# Patient Record
Sex: Male | Born: 1975 | Race: White | Hispanic: No | Marital: Married | Smoking: Never smoker
Health system: Southern US, Community
[De-identification: ages and names within clinical notes are randomized; demographics above are authoritative.]

---

## 2000-04-03 ENCOUNTER — Emergency Department (HOSPITAL_COMMUNITY): Admission: EM | Admit: 2000-04-03 | Discharge: 2000-04-03 | Payer: Self-pay | Admitting: Emergency Medicine

## 2000-04-04 ENCOUNTER — Encounter: Payer: Self-pay | Admitting: Emergency Medicine

## 2008-02-08 ENCOUNTER — Emergency Department: Payer: Self-pay | Admitting: Emergency Medicine

## 2008-02-08 ENCOUNTER — Other Ambulatory Visit: Payer: Self-pay

## 2008-04-15 ENCOUNTER — Emergency Department: Payer: Self-pay | Admitting: Emergency Medicine

## 2008-07-23 ENCOUNTER — Emergency Department: Payer: Self-pay | Admitting: Emergency Medicine

## 2008-09-18 ENCOUNTER — Emergency Department: Payer: Self-pay | Admitting: Emergency Medicine

## 2008-10-16 ENCOUNTER — Emergency Department: Payer: Self-pay | Admitting: Emergency Medicine

## 2008-10-17 ENCOUNTER — Emergency Department: Payer: Self-pay

## 2008-12-06 IMAGING — CR DG CHEST 2V
1 series · 2 of 2 positions shown · non-contrast
Comparison: none

REASON FOR EXAM: Chest Pain
COMMENTS:

[Series 1: view not recorded · 0.17mm/px · 2 of 2 slices shown]
[im 1/2]
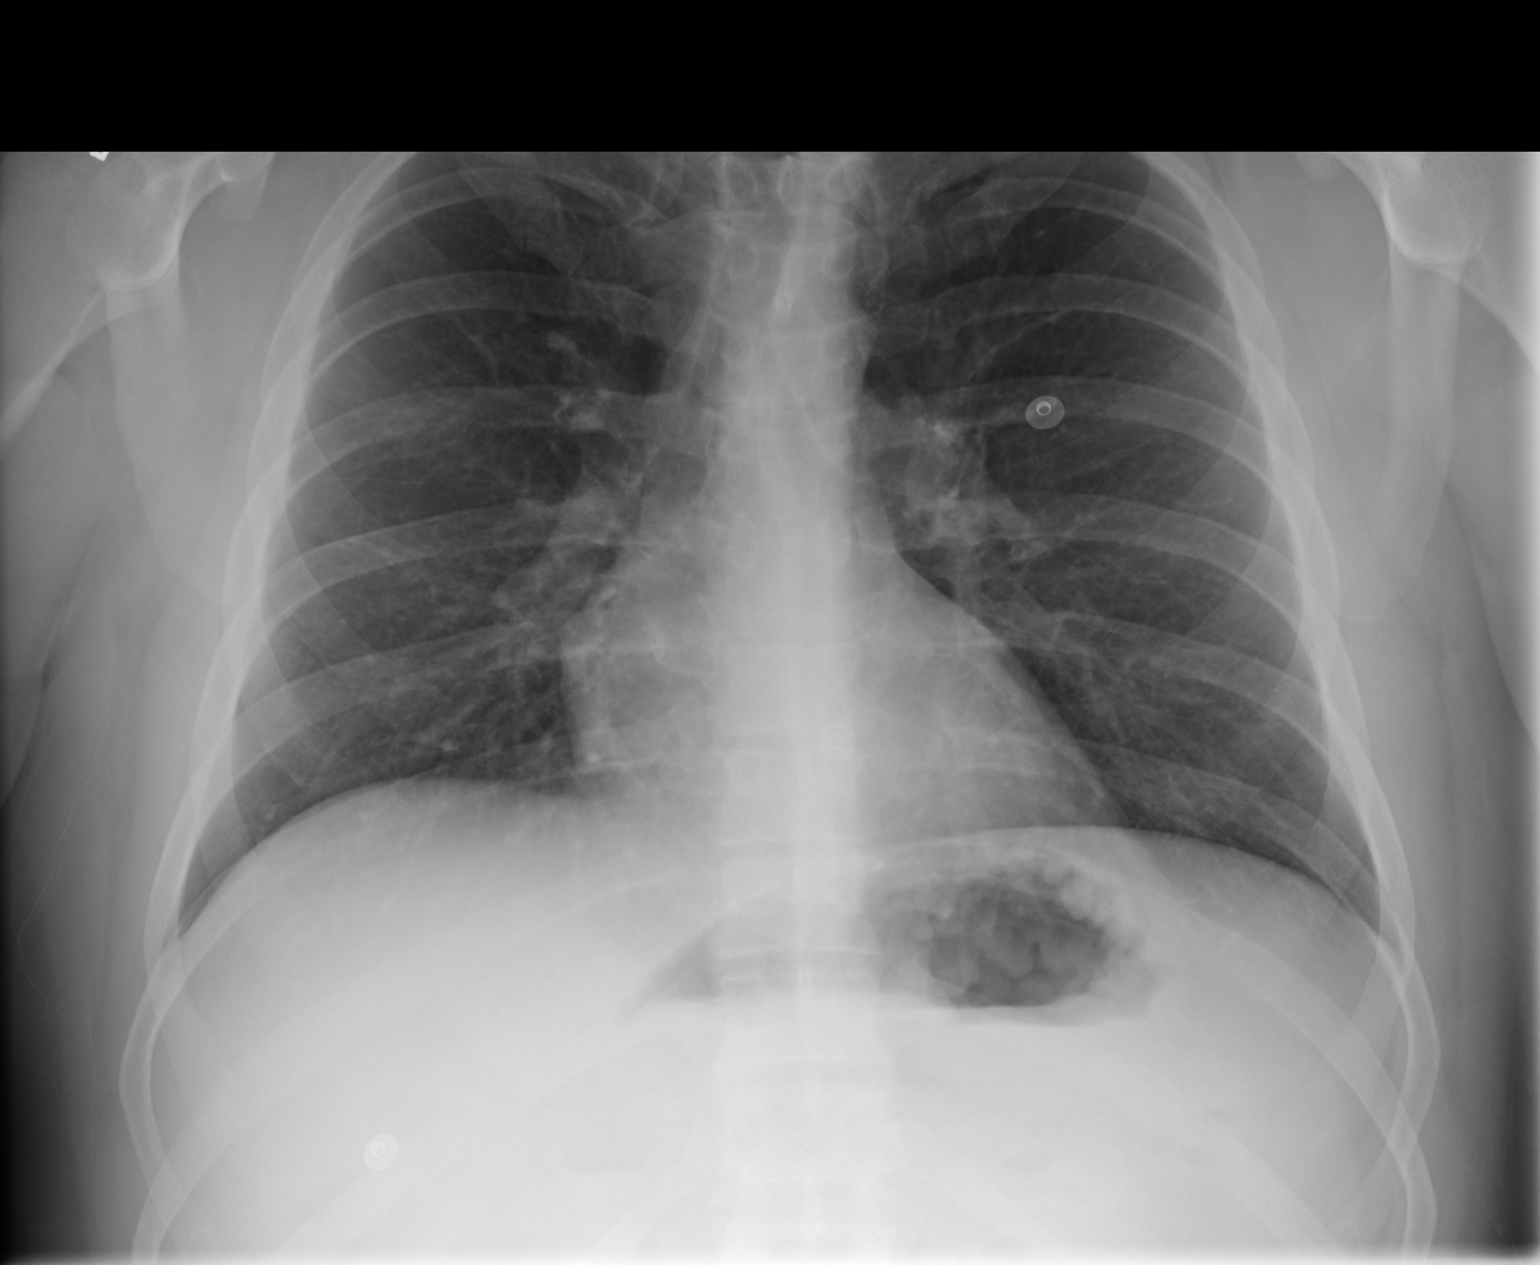
[im 2/2]
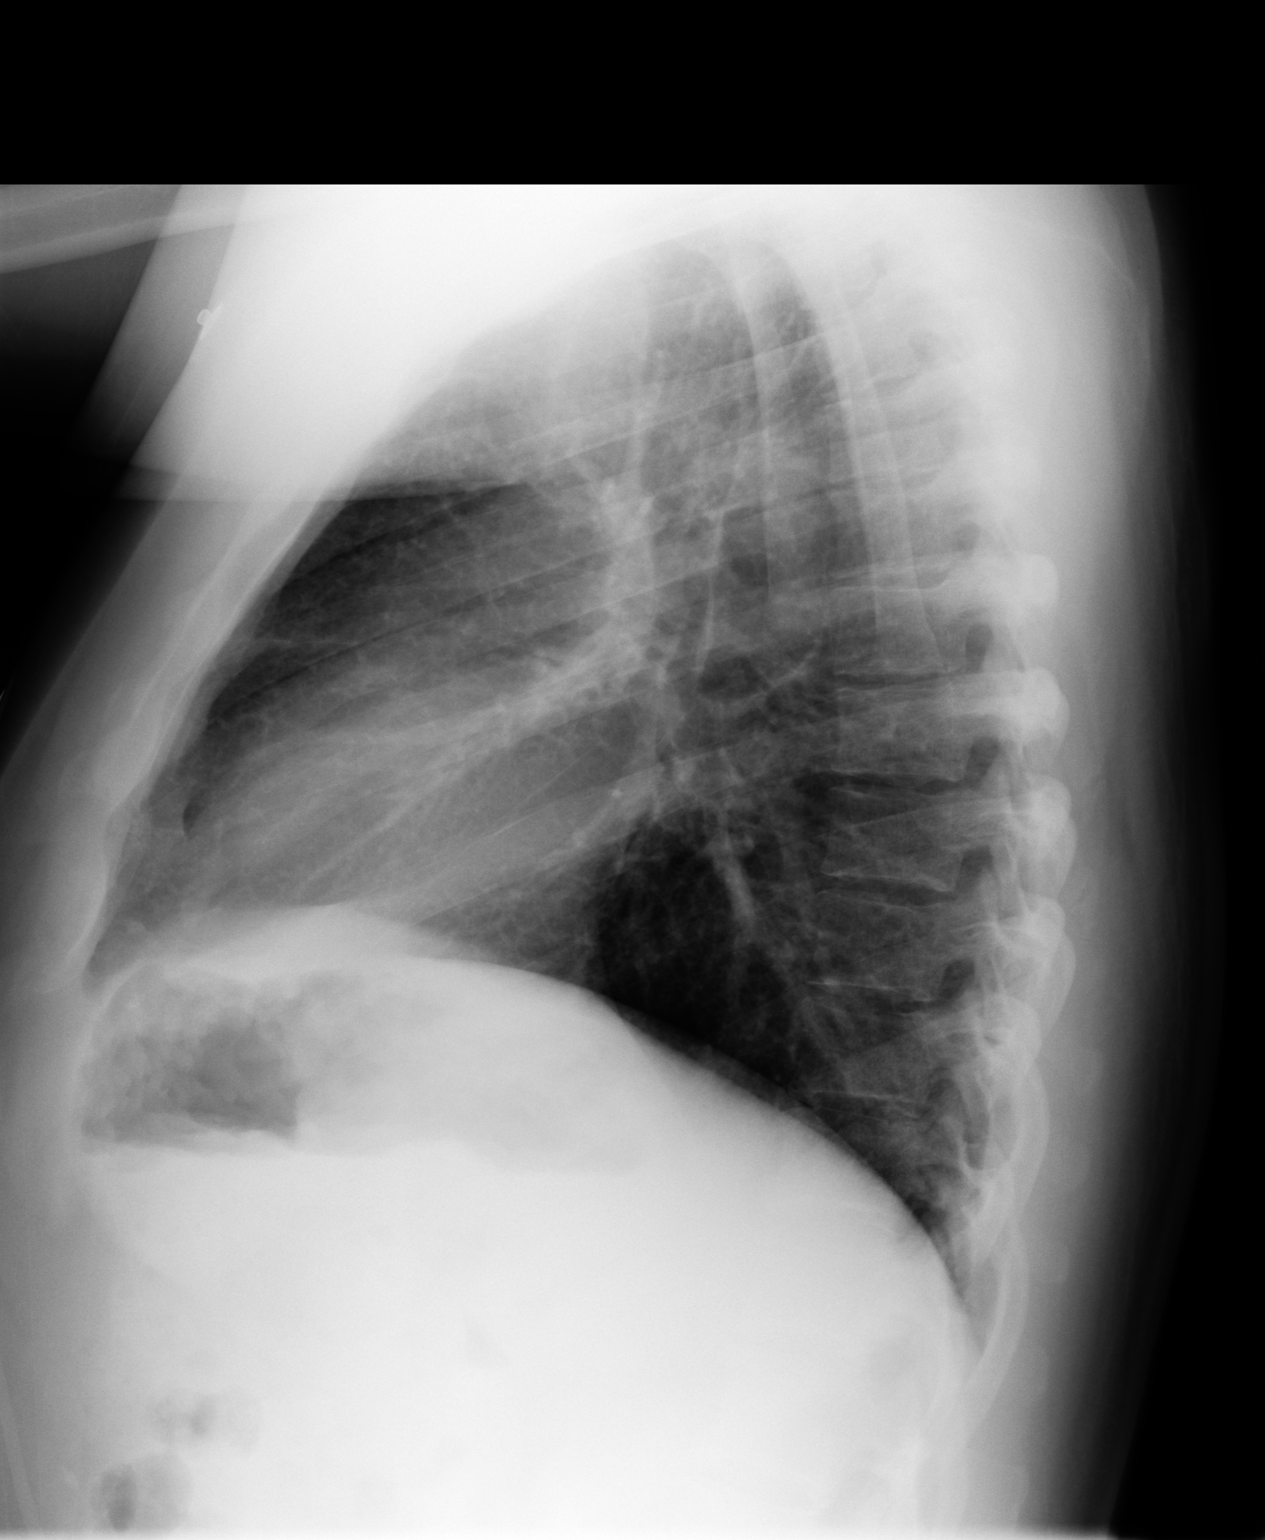

[2 of 2 positions shown; findings below may reference images not displayed]

PROCEDURE:     DXR - DXR CHEST PA (OR AP) AND LATERAL  - February 08, 2008  [DATE]

RESULT:     The lungs are mildly hypoinflated. Very faint increased density
in the RIGHT mid lung is noted. The heart is normal in size. The pulmonary
vascularity is prominent centrally. There is no pleural effusion.
IMPRESSION: The lungs are very mildly hypoinflated.  Subtle increased
density in the RIGHT mid lung is noted. I see no acute abnormality
elsewhere. A followup PA and lateral chest x-ray with deep inspiration would
be useful to assure that there is no very early infiltrate developing in the
right mid lung.

## 2015-05-30 ENCOUNTER — Emergency Department
Admission: EM | Admit: 2015-05-30 | Discharge: 2015-05-30 | Disposition: A | Discharge status: Home / Self Care | Payer: Self-pay | Attending: Emergency Medicine | Admitting: Emergency Medicine

## 2015-05-30 DIAGNOSIS — K219 Gastro-esophageal reflux disease without esophagitis: Secondary | ICD-10-CM | POA: Insufficient documentation

## 2015-05-30 DIAGNOSIS — K529 Noninfective gastroenteritis and colitis, unspecified: Secondary | ICD-10-CM | POA: Insufficient documentation

## 2015-05-30 LAB — CBC WITH DIFFERENTIAL/PLATELET
BASOS ABS: 0.1 10*3/uL (ref 0–0.1)
Basophils Relative: 1 %
Eosinophils Absolute: 0.4 10*3/uL (ref 0–0.7)
Eosinophils Relative: 4 %
HEMATOCRIT: 44 % (ref 40.0–52.0)
HEMOGLOBIN: 15 g/dL (ref 13.0–18.0)
LYMPHS PCT: 39 %
Lymphs Abs: 4.3 10*3/uL — ABNORMAL HIGH (ref 1.0–3.6)
MCH: 30 pg (ref 26.0–34.0)
MCHC: 34.2 g/dL (ref 32.0–36.0)
MCV: 87.8 fL (ref 80.0–100.0)
MONO ABS: 1 10*3/uL (ref 0.2–1.0)
MONOS PCT: 9 %
NEUTROS ABS: 5.1 10*3/uL (ref 1.4–6.5)
NEUTROS PCT: 47 %
Platelets: 347 10*3/uL (ref 150–440)
RBC: 5.01 MIL/uL (ref 4.40–5.90)
RDW: 12.9 % (ref 11.5–14.5)
WBC: 10.8 10*3/uL — ABNORMAL HIGH (ref 3.8–10.6)

## 2015-05-30 LAB — COMPREHENSIVE METABOLIC PANEL
ALBUMIN: 4.3 g/dL (ref 3.5–5.0)
ALK PHOS: 73 U/L (ref 38–126)
ALT: 26 U/L (ref 17–63)
ANION GAP: 7 (ref 5–15)
AST: 27 U/L (ref 15–41)
BILIRUBIN TOTAL: 0.8 mg/dL (ref 0.3–1.2)
BUN: 16 mg/dL (ref 6–20)
CO2: 23 mmol/L (ref 22–32)
Calcium: 9 mg/dL (ref 8.9–10.3)
Chloride: 108 mmol/L (ref 101–111)
Creatinine, Ser: 0.8 mg/dL (ref 0.61–1.24)
GFR calc Af Amer: >60 mL/min (ref >60)
GFR calc non Af Amer: >60 mL/min (ref >60)
GLUCOSE: 112 mg/dL — AB (ref 65–99)
Potassium: 3.6 mmol/L (ref 3.5–5.1)
Sodium: 138 mmol/L (ref 135–145)
Total Protein: 7.5 g/dL (ref 6.5–8.1)

## 2015-05-30 LAB — TROPONIN I: Troponin I: <0.03 ng/mL (ref <0.031)

## 2015-05-30 LAB — LIPASE, BLOOD: Lipase: 30 U/L (ref 11–51)

## 2015-05-30 MED ORDER — GI COCKTAIL ~~LOC~~
30.0000 mL | Freq: Once | ORAL | Status: AC
  Administered 2015-05-30: 30 mL via ORAL

## 2015-05-30 MED ORDER — PANTOPRAZOLE SODIUM 40 MG PO TBEC: 40.0000 mg | DELAYED_RELEASE_TABLET | Freq: Every day | ORAL | Status: DC

## 2015-05-30 MED ORDER — GI COCKTAIL ~~LOC~~
ORAL | Status: AC
  Administered 2015-05-30: 30 mL via ORAL
  Filled 2015-05-30: qty 30

## 2015-05-30 MED ORDER — PANTOPRAZOLE SODIUM 40 MG PO TBEC
DELAYED_RELEASE_TABLET | ORAL | Status: AC
Start: 2015-05-30 — End: 2015-05-30
  Administered 2015-05-30: 40 mg via ORAL
  Filled 2015-05-30: qty 1

## 2015-05-30 MED ORDER — PANTOPRAZOLE SODIUM 40 MG PO TBEC
40.0000 mg | DELAYED_RELEASE_TABLET | Freq: Once | ORAL | Status: AC
  Administered 2015-05-30: 40 mg via ORAL

## 2015-05-30 NOTE — ED Notes (Signed)
Pt in with co left upper abd pain x 1 week, hx of reflux.  Worse after he eats, no vomiting.

## 2015-05-30 NOTE — ED Provider Notes (Signed)
[facility] Emergency Department Provider Note  ____________________________________________  Time seen: 12:50 AM  I have reviewed the triage vital signs and the nursing notes.   HISTORY  Chief Complaint Abdominal Pain     HPI Reginald Randall is a 39 y.o. male presents with epigastric abdominal pain with onset 5 days ago status post eating "[place] food" patient reports nonbloody emesis and diarrhea. Patient states history of GERD   Past medical history GERD There are no active problems to display for this patient.  Past surgical history None No current outpatient prescriptions on file.  Allergies Review of patient's allergies indicates no known allergies.  No family history on file.  Social History Social History  Substance Use Topics  . Smoking status: Not on file  . Smokeless tobacco: Not on file  . Alcohol Use: Not on file    Review of Systems  Constitutional: Negative for fever. Eyes: Negative for visual changes. ENT: Negative for sore throat. Cardiovascular: Negative for chest pain. Respiratory: Negative for shortness of breath. Gastrointestinal: Positive for abdominal pain and vomiting and diarrhea Genitourinary: Negative for dysuria. Musculoskeletal: Negative for back pain. Skin: Negative for rash. Neurological: Negative for headaches, focal weakness or numbness.   10-point ROS otherwise negative.  ____________________________________________   PHYSICAL EXAM:  VITAL SIGNS: ED Triage Vitals  Enc Vitals Group     BP 05/30/15 0009 129/73 mmHg     Pulse Rate 05/30/15 0009 109     Resp 05/30/15 0009 18     Temp 05/30/15 0009 98.6 F (37 C)     Temp Source 05/30/15 0009 Oral     SpO2 05/30/15 0009 97 %     Weight 05/30/15 0009 284 lb (128.822 kg)     Height 05/30/15 0009  (1.778 m)     Head Cir --      Peak Flow --      Pain Score 05/30/15 0009 6     Pain Loc --      Pain Edu? --      Excl. in GC? --     Constitutional: Alert and oriented. Well appearing and in no distress. Eyes: Conjunctivae are normal. PERRL. Normal extraocular movements. ENT   Head: Normocephalic and atraumatic.   Nose: No congestion/rhinnorhea.   Mouth/Throat: Mucous membranes are moist.   Neck: No stridor. Hematological/Lymphatic/Immunilogical: No cervical lymphadenopathy. Cardiovascular: Normal rate, regular rhythm. Normal and symmetric distal pulses are present in all extremities. No murmurs, rubs, or gallops. Respiratory: Normal respiratory effort without tachypnea nor retractions. Breath sounds are clear and equal bilaterally. No wheezes/rales/rhonchi. Gastrointestinal: Epigastric tenderness to palpation No distention. There is no CVA tenderness. Genitourinary: deferred Musculoskeletal: Nontender with normal range of motion in all extremities. No joint effusions.  No lower extremity tenderness nor edema. Neurologic:  Normal speech and language. No gross focal neurologic deficits are appreciated. Speech is normal.  Skin:  Skin is warm, dry and intact. No rash noted. Psychiatric: Mood and affect are normal. Speech and behavior are normal. Patient exhibits appropriate insight and judgment.  ____________________________________________    LABS (pertinent positives/negatives)  Labs Reviewed  CBC WITH DIFFERENTIAL/PLATELET - Abnormal; Notable for the following:    WBC 10.8 (*)    Lymphs Abs 4.3 (*)    All other components within normal limits  COMPREHENSIVE METABOLIC PANEL - Abnormal; Notable for the following:    Glucose, Bld 112 (*)    All other components within normal limits  LIPASE, BLOOD  TROPONIN I  ____________________________________________   EKG  ED ECG REPORT I, Quina Wilbourne, [place] N, the attending physician, personally viewed and interpreted this ECG.   Date: 05/30/2015  EKG Time: 12:16 AM  Rate: 90  Rhythm: Normal sinus rhythm  Axis: None  Intervals: Normal  ST&T  Change: None     INITIAL IMPRESSION / ASSESSMENT AND PLAN / ED COURSE  Pertinent labs & imaging results that were available during my care of the patient were reviewed by me and considered in my medical decision making (see chart for details).  Patient received GI cocktail and Protonix with complete resolution of discomfort. History of physical exam consistent with possible gastroenteritis and GERD.  ____________________________________________   FINAL CLINICAL IMPRESSION(S) / ED DIAGNOSES  Final diagnoses:  Gastroesophageal reflux disease, esophagitis presence not specified  Gastroenteritis      Darci Currentandolph N Alvester Eads, MD 05/30/15 [phone]

## 2015-05-30 NOTE — Discharge Instructions (Signed)

## 2015-05-30 NOTE — ED Notes (Signed)
Pt reports having left sided rib and epigastric pain since last Saturday after eating "chinese food" tpt reports NVD and tenderness to left chest upon palpation. Pt reports hx of GERD but verbalizes the pain is different than usual. Pt also states SOB when lying down and inability to sleep due to pain and SOB.

## 2015-06-10 ENCOUNTER — Emergency Department
Admission: EM | Admit: 2015-06-10 | Discharge: 2015-06-10 | Disposition: A | Discharge status: Home / Self Care | Payer: Self-pay | Attending: Emergency Medicine | Admitting: Emergency Medicine

## 2015-06-10 ENCOUNTER — Encounter: Payer: Self-pay | Admitting: Emergency Medicine

## 2015-06-10 DIAGNOSIS — Z79899 Other long term (current) drug therapy: Secondary | ICD-10-CM | POA: Insufficient documentation

## 2015-06-10 DIAGNOSIS — K2970 Gastritis, unspecified, without bleeding: Secondary | ICD-10-CM | POA: Insufficient documentation

## 2015-06-10 LAB — COMPREHENSIVE METABOLIC PANEL
ALT: 61 U/L (ref 17–63)
ANION GAP: 7 (ref 5–15)
AST: 48 U/L — AB (ref 15–41)
Albumin: 4.2 g/dL (ref 3.5–5.0)
Alkaline Phosphatase: 63 U/L (ref 38–126)
BILIRUBIN TOTAL: 0.4 mg/dL (ref 0.3–1.2)
BUN: 17 mg/dL (ref 6–20)
CHLORIDE: 106 mmol/L (ref 101–111)
CO2: 24 mmol/L (ref 22–32)
Calcium: 9.3 mg/dL (ref 8.9–10.3)
Creatinine, Ser: 0.91 mg/dL (ref 0.61–1.24)
Glucose, Bld: 119 mg/dL — ABNORMAL HIGH (ref 65–99)
POTASSIUM: 3.8 mmol/L (ref 3.5–5.1)
Sodium: 137 mmol/L (ref 135–145)
TOTAL PROTEIN: 7.6 g/dL (ref 6.5–8.1)

## 2015-06-10 LAB — CBC
HEMATOCRIT: 44.5 % (ref 40.0–52.0)
HEMOGLOBIN: 15.1 g/dL (ref 13.0–18.0)
MCH: 29.9 pg (ref 26.0–34.0)
MCHC: 34 g/dL (ref 32.0–36.0)
MCV: 88.1 fL (ref 80.0–100.0)
Platelets: 322 10*3/uL (ref 150–440)
RBC: 5.05 MIL/uL (ref 4.40–5.90)
RDW: 13.2 % (ref 11.5–14.5)
WBC: 9.2 10*3/uL (ref 3.8–10.6)

## 2015-06-10 LAB — TROPONIN I: Troponin I: <0.03 ng/mL (ref <0.031)

## 2015-06-10 LAB — LIPASE, BLOOD: LIPASE: 25 U/L (ref 11–51)

## 2015-06-10 MED ORDER — SUCRALFATE 1 G PO TABS: 1.0000 g | ORAL_TABLET | Freq: Four times a day (QID) | ORAL | Status: AC

## 2015-06-10 MED ORDER — GI COCKTAIL ~~LOC~~
30.0000 mL | Freq: Once | ORAL | Status: AC
  Administered 2015-06-10: 30 mL via ORAL
  Filled 2015-06-10: qty 30

## 2015-06-10 NOTE — Discharge Instructions (Signed)

## 2015-06-10 NOTE — ED Provider Notes (Signed)
[facility] Emergency Department Provider Note  ____________________________________________  Time seen: On arrival  I have reviewed the triage vital signs and the nursing notes.   HISTORY  Chief Complaint Abdominal Pain and Emesis    HPI Reginald Randall is a 39 y.o. male who complains of moderate burning epigastric discomfort for approximately the last month. He reports every morning he wakes up feeling nauseous with a sensation of acid in his throat. He also complains of frequent throat clearing. Complains of burning discomfort in the left upper quadrant/epigastrium that will occasionally radiate into his chest which he feels is his esophagus spasming. He denies shortness of breath, no fevers no chills. Does report a history of H. pylori in the past. Also reports significant stress. No history of heart disease, no recent travel.     History reviewed. No pertinent past medical history.  There are no active problems to display for this patient.   History reviewed. No pertinent past surgical history.  Current Outpatient Rx  Name  Route  Sig  Dispense  Refill  . pantoprazole (PROTONIX) 40 MG tablet   Oral   Take 1 tablet (40 mg total) by mouth daily.   30 tablet   0     Allergies Review of patient's allergies indicates no known allergies.  No family history on file.  Social History Social History  Substance Use Topics  . Smoking status: Never Smoker   . Smokeless tobacco: None  . Alcohol Use: No    Review of Systems  Constitutional: Negative for fever. Eyes: Negative for visual changes. ENT: Negative for sore throat Cardiovascular: Negative for chest pain. Respiratory: Negative for shortness of breath. Gastrointestinal: No diarrhea Genitourinary: Negative for dysuria. Musculoskeletal: Negative for back pain. Skin: Negative for rash. Neurological: Negative for headaches Psychiatric: Feels  stressed    ____________________________________________   PHYSICAL EXAM:  VITAL SIGNS: ED Triage Vitals  Enc Vitals Group     BP 06/10/15 0830 146/92 mmHg     Pulse Rate 06/10/15 0830 106     Resp 06/10/15 0830 20     Temp 06/10/15 0830 97.8 F (36.6 C)     Temp Source 06/10/15 0830 Oral     SpO2 06/10/15 0830 95 %     Weight --      Height --      Head Cir --      Peak Flow --      Pain Score 06/10/15 0817 7     Pain Loc --      Pain Edu? --      Excl. in GC? --      Constitutional: Alert and oriented. Well appearing and in no distress. Eyes: Conjunctivae are normal.  ENT   Head: Normocephalic and atraumatic.   Mouth/Throat: Mucous membranes are moist. Cardiovascular: Normal rate, regular rhythm. Normal and symmetric distal pulses are present in all extremities. Respiratory: Normal respiratory effort without tachypnea nor retractions. Breath sounds are clear and equal bilaterally.  Gastrointestinal: Soft and non-tender in all quadrants. No distention. There is no CVA tenderness. Genitourinary: deferred Musculoskeletal:  No lower extremity tenderness nor edema. Neurologic:  Normal speech and language. No gross focal neurologic deficits are appreciated. Skin:  Skin is warm, dry and intact. No rash noted. Psychiatric: Mood and affect are normal. Patient exhibits appropriate insight and judgment.  ____________________________________________    LABS (pertinent positives/negatives)  Labs Reviewed  LIPASE, BLOOD  COMPREHENSIVE METABOLIC PANEL  CBC  TROPONIN I  ____________________________________________   EKG  ED ECG REPORT I, Jene Every, the attending physician, personally viewed and interpreted this ECG.  Date: 06/10/2015 EKG Time: 8:24 AM Rate: 105 Rhythm: Sinus tachycardia QRS Axis: normal Intervals: normal ST/T Wave abnormalities: normal Conduction Disutrbances: none Narrative Interpretation:  unremarkable   ____________________________________________    RADIOLOGY I have personally reviewed any xrays that were ordered on this patient: None  ____________________________________________   PROCEDURES  Procedure(s) performed: none  Critical Care performed: none  ____________________________________________   INITIAL IMPRESSION / ASSESSMENT AND PLAN / ED COURSE  Pertinent labs & imaging results that were available during my care of the patient were reviewed by me and considered in my medical decision making (see chart for details).  Patient overall well-appearing. History of present illness is most consistent with gastritis versus PUD. We will check abdominal labs as well as a troponin and EKG. Will give a GI cocktail which provided him significant relief last time he was in the emergency department.  Patient reports relief after GI cocktail. Labs are unremarkable, EKG is normal. We will refer patient to GI for suspected PUD versus gastritis ____________________________________________   FINAL CLINICAL IMPRESSION(S) / ED DIAGNOSES  Final diagnoses:  Gastritis     Jene Every, MD 06/10/15 [phone]

## 2015-06-10 NOTE — ED Notes (Signed)
Pt reports LUQ pain n/v x3weeks. Pt with hx of ulcers and has had H Pylori about eight yrs ago. Pt reports he drinks a  Moderate amount of caffeine. No acute distress noted at this time. Protocols initiated and is awaiting lab results.

## 2015-06-10 NOTE — ED Notes (Signed)
C/o LUQ abd. Pain with n,v x 3 weeks

## 2015-06-10 NOTE — ED Notes (Signed)
Pt reports that the GI Cocktail given earlier has helped his pain in his abd.

## 2015-06-15 ENCOUNTER — Emergency Department
Admission: EM | Admit: 2015-06-15 | Discharge: 2015-06-15 | Disposition: A | Discharge status: Home / Self Care | Payer: Self-pay | Attending: Emergency Medicine | Admitting: Emergency Medicine

## 2015-06-15 ENCOUNTER — Encounter: Payer: Self-pay | Admitting: Emergency Medicine

## 2015-06-15 ENCOUNTER — Emergency Department: Payer: Self-pay

## 2015-06-15 DIAGNOSIS — R1013 Epigastric pain: Secondary | ICD-10-CM

## 2015-06-15 DIAGNOSIS — Z79899 Other long term (current) drug therapy: Secondary | ICD-10-CM | POA: Insufficient documentation

## 2015-06-15 DIAGNOSIS — K2970 Gastritis, unspecified, without bleeding: Secondary | ICD-10-CM | POA: Insufficient documentation

## 2015-06-15 LAB — COMPREHENSIVE METABOLIC PANEL
ALK PHOS: 70 U/L (ref 38–126)
ALT: 62 U/L (ref 17–63)
AST: 44 U/L — ABNORMAL HIGH (ref 15–41)
Albumin: 4.4 g/dL (ref 3.5–5.0)
Anion gap: 9 (ref 5–15)
BILIRUBIN TOTAL: 0.8 mg/dL (ref 0.3–1.2)
BUN: 13 mg/dL (ref 6–20)
CHLORIDE: 107 mmol/L (ref 101–111)
CO2: 23 mmol/L (ref 22–32)
CREATININE: 0.92 mg/dL (ref 0.61–1.24)
Calcium: 9.7 mg/dL (ref 8.9–10.3)
GFR calc Af Amer: >60 mL/min (ref >60)
GLUCOSE: 118 mg/dL — AB (ref 65–99)
POTASSIUM: 3.9 mmol/L (ref 3.5–5.1)
Sodium: 139 mmol/L (ref 135–145)
Total Protein: 7.7 g/dL (ref 6.5–8.1)

## 2015-06-15 LAB — CBC WITH DIFFERENTIAL/PLATELET
Basophils Absolute: 0.1 10*3/uL (ref 0–0.1)
Basophils Relative: 1 %
Eosinophils Absolute: 0.4 10*3/uL (ref 0–0.7)
Eosinophils Relative: 4 %
HEMATOCRIT: 48 % (ref 40.0–52.0)
HEMOGLOBIN: 16.1 g/dL (ref 13.0–18.0)
LYMPHS ABS: 4.7 10*3/uL — AB (ref 1.0–3.6)
LYMPHS PCT: 48 %
MCH: 29.7 pg (ref 26.0–34.0)
MCHC: 33.7 g/dL (ref 32.0–36.0)
MCV: 88.4 fL (ref 80.0–100.0)
MONO ABS: 1.1 10*3/uL — AB (ref 0.2–1.0)
MONOS PCT: 11 %
NEUTROS ABS: 3.6 10*3/uL (ref 1.4–6.5)
Neutrophils Relative %: 36 %
Platelets: 342 10*3/uL (ref 150–440)
RBC: 5.43 MIL/uL (ref 4.40–5.90)
RDW: 13.2 % (ref 11.5–14.5)
WBC: 9.8 10*3/uL (ref 3.8–10.6)

## 2015-06-15 LAB — SALICYLATE LEVEL: Salicylate Lvl: <4 mg/dL (ref 2.8–30.0)

## 2015-06-15 LAB — LIPASE, BLOOD: LIPASE: 31 U/L (ref 11–51)

## 2015-06-15 LAB — ETHANOL

## 2015-06-15 LAB — ACETAMINOPHEN LEVEL: Acetaminophen (Tylenol), Serum: <10 ug/mL — ABNORMAL LOW (ref 10–30)

## 2015-06-15 MED ORDER — PANTOPRAZOLE SODIUM 40 MG PO TBEC: 40.0000 mg | DELAYED_RELEASE_TABLET | Freq: Every day | ORAL | Status: AC

## 2015-06-15 MED ORDER — GI COCKTAIL ~~LOC~~
30.0000 mL | Freq: Once | ORAL | Status: AC
  Administered 2015-06-15: 30 mL via ORAL

## 2015-06-15 MED ORDER — GI COCKTAIL ~~LOC~~
ORAL | Status: AC
  Administered 2015-06-15: 30 mL via ORAL
  Filled 2015-06-15: qty 30

## 2015-06-15 MED ORDER — PANTOPRAZOLE SODIUM 40 MG IV SOLR
40.0000 mg | Freq: Once | INTRAVENOUS | Status: AC
  Administered 2015-06-15: 40 mg via INTRAVENOUS
  Filled 2015-06-15: qty 40

## 2015-06-15 NOTE — ED Notes (Signed)
Pt reports LUQ pain radiating to epigastric area x 3.5 weeks, been seen multiple times in ed.  Pt reports hx ulcers, states antibiotics helped last time.  Pt denies diarrhea, reports nausea w/o vomiting.   Pt nad at this time, respirations equal and unlabored, skin warm and dry.

## 2015-06-15 NOTE — ED Provider Notes (Signed)
[facility] Emergency Department Provider Note  ____________________________________________  Time seen: Approximately 4:54 AM  I have reviewed the triage vital signs and the nursing notes.   HISTORY  Chief Complaint Abdominal Pain    HPI Vonita MossShannon L Allensworth is a 39 y.o. male who presents to the ED with a chief complaint of abdominal pain. Patient reports left upper quadrant and epigastric burning type pain for 3-1/2 weeks. He has been seen in the ED twice recently for same. Reports past history of ulcers with positive H. pylori he which was treated with antibiotics. Currently does not take any GERD medications except for Carafate which was recently prescribed in the ED.States he has adjusted his dietary and drinking habits but awoke tonight with sharp, burning type pain in his left upper quadrant radiating to his epigastrium. Symptoms associated with nausea without vomiting. Denies recent fever, chills, chest pain, shortness of breath, vomiting, diarrhea. Denies recent travel or trauma. Reports a GI cocktail usually works well for his pain.   Past medical history Ulcers H. pylori   There are no active problems to display for this patient.   History reviewed. No pertinent past surgical history.  Current Outpatient Rx  Name  Route  Sig  Dispense  Refill  . pantoprazole (PROTONIX) 40 MG tablet   Oral   Take 1 tablet (40 mg total) by mouth daily.   30 tablet   0   . sucralfate (CARAFATE) 1 G tablet   Oral   Take 1 tablet (1 g total) by mouth 4 (four) times daily.   80 tablet   1   . esomeprazole (NEXIUM) 20 MG capsule   Oral   Take 20 mg by mouth 2 (two) times daily.           Allergies Review of patient's allergies indicates no known allergies.  No family history on file.  Social History Social History  Substance Use Topics  . Smoking status: Never Smoker   . Smokeless tobacco: None  . Alcohol Use: No    Review of  Systems Constitutional: No fever/chills Eyes: No visual changes. ENT: No sore throat. Cardiovascular: Denies chest pain. Respiratory: Denies shortness of breath. Gastrointestinal: Positive for abdominal pain.  Positive for nausea, no vomiting.  No diarrhea.  No constipation. Genitourinary: Negative for dysuria. Musculoskeletal: Negative for back pain. Skin: Negative for rash. Neurological: Negative for headaches, focal weakness or numbness.  10-point ROS otherwise negative.  ____________________________________________   PHYSICAL EXAM:  VITAL SIGNS: ED Triage Vitals  Enc Vitals Group     BP 06/15/15 0230 140/124 mmHg     Pulse Rate 06/15/15 0230 111     Resp 06/15/15 0230 24     Temp 06/15/15 0230 97.7 F (36.5 C)     Temp Source 06/15/15 0230 Oral     SpO2 06/15/15 0230 96 %     Weight 06/15/15 0230 280 lb (127.007 kg)     Height 06/15/15 0230 5\' 10"  (1.778 m)     Head Cir --      Peak Flow --      Pain Score 06/15/15 0253 9     Pain Loc --      Pain Edu? --      Excl. in GC? --     Constitutional: Alert and oriented. Well appearing and in no acute distress. Eyes: Conjunctivae are normal. PERRL. EOMI. Head: Atraumatic. Nose: No congestion/rhinnorhea. Mouth/Throat: Mucous membranes are moist.  Oropharynx non-erythematous. Neck: No stridor.  Cardiovascular: Normal rate, regular rhythm. Grossly normal heart sounds.  Good peripheral circulation. Respiratory: Normal respiratory effort.  No retractions. Lungs CTAB. Gastrointestinal: Soft and mildly tender to palpation epigastrium without rebound or guarding. No distention. No abdominal bruits. No CVA tenderness. Musculoskeletal: No lower extremity tenderness nor edema.  No joint effusions. Neurologic:  Normal speech and language. No gross focal neurologic deficits are appreciated. No gait instability. Skin:  Skin is warm, dry and intact. No rash noted. Psychiatric: Mood and affect are normal. Speech and behavior are  normal.  ____________________________________________   LABS (all labs ordered are listed, but only abnormal results are displayed)  Labs Reviewed  CBC WITH DIFFERENTIAL/PLATELET - Abnormal; Notable for the following:    Lymphs Abs 4.7 (*)    Monocytes Absolute 1.1 (*)    All other components within normal limits  COMPREHENSIVE METABOLIC PANEL - Abnormal; Notable for the following:    Glucose, Bld 118 (*)    AST 44 (*)    All other components within normal limits  ACETAMINOPHEN LEVEL - Abnormal; Notable for the following:    Acetaminophen (Tylenol), Serum <10 (*)    All other components within normal limits  ETHANOL  SALICYLATE LEVEL  LIPASE, BLOOD   ____________________________________________  EKG  ED ECG REPORT I, SUNG,JADE J, the attending physician, personally viewed and interpreted this ECG.   Date: 06/15/2015  EKG Time: 0307  Rate: 72  Rhythm: normal EKG, normal sinus rhythm  Axis: Normal  Intervals:none  ST&T Change: Nonspecific  ____________________________________________  RADIOLOGY  Ultrasound RUQ interpreted per Dr. Cherly Hensen: 1. No definite acute abnormality seen within the right upper quadrant. 2. Slight prominence of the common bile duct may remain within normal limits, measuring 0.7 cm. 3. Diffuse fatty infiltration within the liver.  ____________________________________________   PROCEDURES  Procedure(s) performed: None  Critical Care performed: No  ____________________________________________   INITIAL IMPRESSION / ASSESSMENT AND PLAN / ED COURSE  Pertinent labs & imaging results that were available during my care of the patient were reviewed by me and considered in my medical decision making (see chart for details).  39 year old male who presents to the ED for the third time this month with epigastric burning sensation suggestive of gastritis. He has not had imaging studies so we will obtain ultrasound to evaluate for cholelithiasis. GI  cocktail ordered per patient's request.  ----------------------------------------- 6:35 AM on 06/15/2015 -----------------------------------------  Patient improved. Resting comfortably in no acute distress. Updated patient of laboratory and imaging results. Patient is currently only taking Carafate; will add prescription for Protonix and recommend outpatient GI follow-up. Strict return precautions given. Patient verbalizes understanding and agrees with plan of care. ____________________________________________   FINAL CLINICAL IMPRESSION(S) / ED DIAGNOSES  Final diagnoses:  Epigastric pain  Gastritis      Irean Hong, MD 06/15/15 [phone]

## 2015-06-15 NOTE — Discharge Instructions (Signed)
1. Continue Carafate daily. 2. Start Protonix 40 mg daily (#30). 3. Please schedule an appointment with the GI specialist for outpatient follow-up. 4. Return to the ER for worsening symptoms, persistent vomiting, difficulty breathing or other concerns.  Abdominal Pain, Adult Many things can cause abdominal pain. Usually, abdominal pain is not caused by a disease and will improve without treatment. It can often be observed and treated at home. Your health care provider will do a physical exam and possibly order blood tests and X-rays to help determine the seriousness of your pain. However, in many cases, more time must pass before a clear cause of the pain can be found. Before that point, your health care provider may not know if you need more testing or further treatment. HOME CARE INSTRUCTIONS Monitor your abdominal pain for any changes. The following actions may help to alleviate any discomfort you are experiencing:  Only take over-the-counter or prescription medicines as directed by your health care provider.  Do not take laxatives unless directed to do so by your health care provider.  Try a clear liquid diet (broth, tea, or water) as directed by your health care provider. Slowly move to a bland diet as tolerated. SEEK MEDICAL CARE IF:  You have unexplained abdominal pain.  You have abdominal pain associated with nausea or diarrhea.  You have pain when you urinate or have a bowel movement.  You experience abdominal pain that wakes you in the night.  You have abdominal pain that is worsened or improved by eating food.  You have abdominal pain that is worsened with eating fatty foods.  You have a fever. SEEK IMMEDIATE MEDICAL CARE IF:  Your pain does not go away within 2 hours.  You keep throwing up (vomiting).  Your pain is felt only in portions of the abdomen, such as the right side or the left lower portion of the abdomen.  You pass bloody or black tarry stools. MAKE SURE  YOU:  Understand these instructions.  Will watch your condition.  Will get help right away if you are not doing well or get worse.   This information is not intended to replace advice given to you by your health care provider. Make sure you discuss any questions you have with your health care provider.   Document Released: 04/14/2005 Document Revised: 03/26/2015 Document Reviewed: 03/14/2013 Elsevier Interactive Patient Education 2016 [employer]  Gastritis, Adult Gastritis is soreness and swelling (inflammation) of the lining of the stomach. Gastritis can develop as a sudden onset (acute) or long-term (chronic) condition. If gastritis is not treated, it can lead to stomach bleeding and ulcers. CAUSES  Gastritis occurs when the stomach lining is weak or damaged. Digestive juices from the stomach then inflame the weakened stomach lining. The stomach lining may be weak or damaged due to viral or bacterial infections. One common bacterial infection is the Helicobacter pylori infection. Gastritis can also result from excessive alcohol consumption, taking certain medicines, or having too much acid in the stomach.  SYMPTOMS  In some cases, there are no symptoms. When symptoms are present, they may include:  Pain or a burning sensation in the upper abdomen.  Nausea.  Vomiting.  An uncomfortable feeling of fullness after eating. DIAGNOSIS  Your caregiver may suspect you have gastritis based on your symptoms and a physical exam. To determine the cause of your gastritis, your caregiver may perform the following:  Blood or stool tests to check for the H pylori bacterium.  Gastroscopy. A thin,  flexible tube (endoscope) is passed down the esophagus and into the stomach. The endoscope has a light and camera on the end. Your caregiver uses the endoscope to view the inside of the stomach.  Taking a tissue sample (biopsy) from the stomach to examine under a microscope. TREATMENT  Depending on  the cause of your gastritis, medicines may be prescribed. If you have a bacterial infection, such as an H pylori infection, antibiotics may be given. If your gastritis is caused by too much acid in the stomach, H2 blockers or antacids may be given. Your caregiver may recommend that you stop taking aspirin, ibuprofen, or other nonsteroidal anti-inflammatory drugs (NSAIDs). HOME CARE INSTRUCTIONS  Only take over-the-counter or prescription medicines as directed by your caregiver.  If you were given antibiotic medicines, take them as directed. Finish them even if you start to feel better.  Drink enough fluids to keep your urine clear or pale yellow.  Avoid foods and drinks that make your symptoms worse, such as:  Caffeine or alcoholic drinks.  Chocolate.  Peppermint or mint flavorings.  Garlic and onions.  Spicy foods.  Citrus fruits, such as oranges, lemons, or limes.  Tomato-based foods such as sauce, chili, salsa, and pizza.  Fried and fatty foods.  Eat small, frequent meals instead of large meals. SEEK IMMEDIATE MEDICAL CARE IF:   You have black or dark red stools.  You vomit blood or material that looks like coffee grounds.  You are unable to keep fluids down.  Your abdominal pain gets worse.  You have a fever.  You do not feel better after 1 week.  You have any other questions or concerns. MAKE SURE YOU:  Understand these instructions.  Will watch your condition.  Will get help right away if you are not doing well or get worse.   This information is not intended to replace advice given to you by your health care provider. Make sure you discuss any questions you have with your health care provider.   Document Released: 06/29/2001 Document Revised: 01/04/2012 Document Reviewed: 08/18/2011 Elsevier Interactive Patient Education [employer]

## 2015-06-15 NOTE — ED Notes (Signed)
Pt complains of abdominal pain to LUQ. Pt states that it has been bothering him for 3.5 weeks and was seen here recently for same complaint.

## 2015-06-15 NOTE — ED Notes (Signed)
Pt taken to [facility]

## 2015-06-25 ENCOUNTER — Encounter: Admission: RE | Payer: Self-pay | Source: Ambulatory Visit

## 2015-06-25 ENCOUNTER — Ambulatory Visit: Admission: RE | Admit: 2015-06-25 | Payer: Self-pay | Source: Ambulatory Visit | Admitting: Gastroenterology

## 2015-06-25 SURGERY — EGD (ESOPHAGOGASTRODUODENOSCOPY)
Anesthesia: Choice

## 2016-08-11 IMAGING — US US ABDOMEN LIMITED
1 series · 14 of 25 positions shown · non-contrast
Comparison: None.

CLINICAL DATA: Subacute onset of epigastric abdominal pain,
radiating to the left upper quadrant. Initial encounter.

EXAM:
US ABDOMEN LIMITED - RIGHT UPPER QUADRANT

[Series 1: us abdomen limited · 0.25mm/px · 14 of 38 slices shown]
[im 1/38]
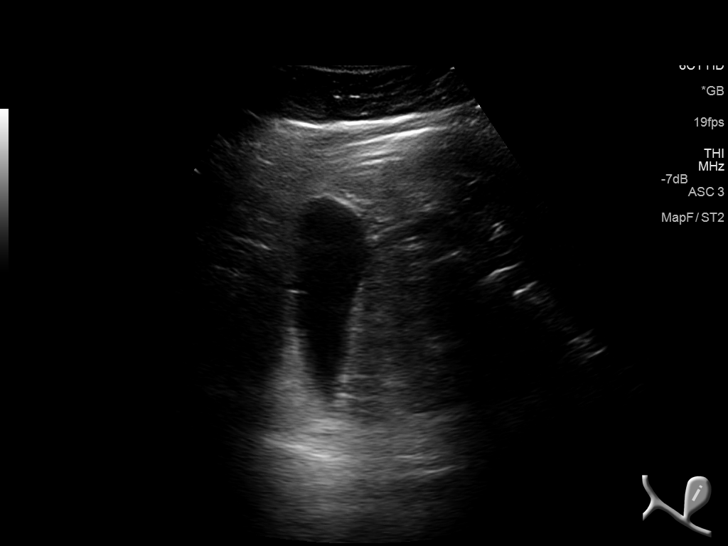
[im 4/38]
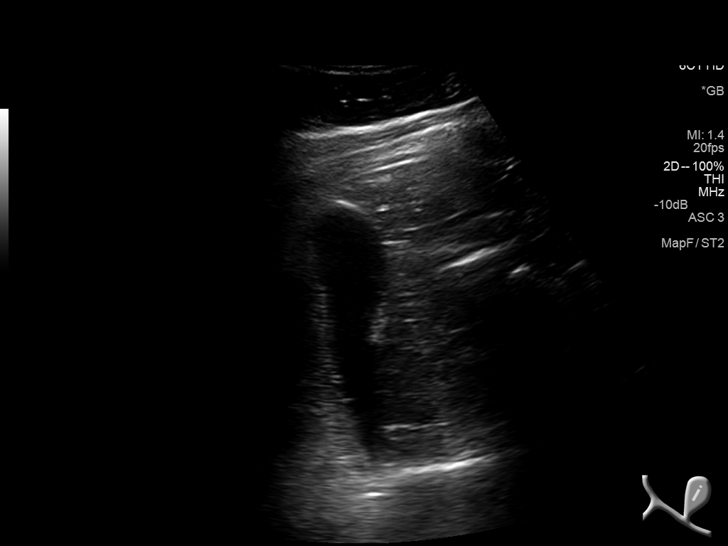
[im 7/38]
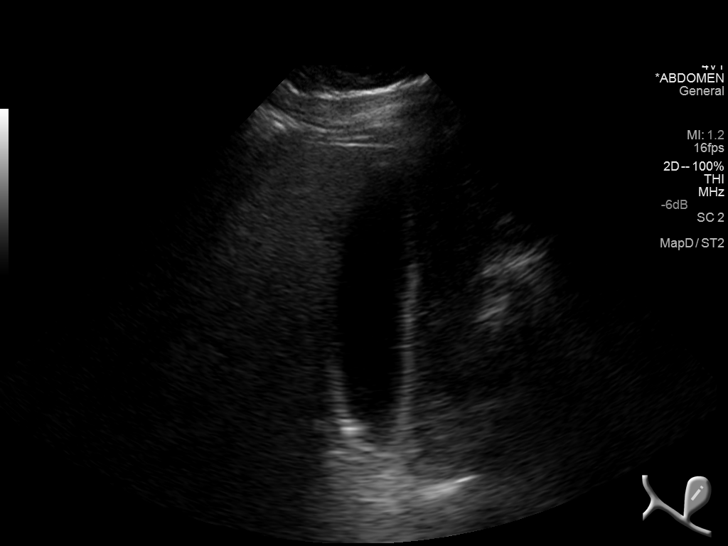
[im 10/38]
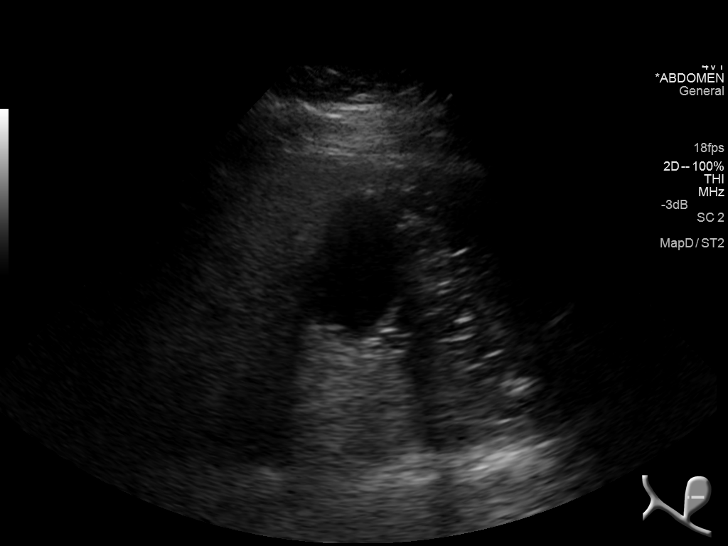
[im 13/38]
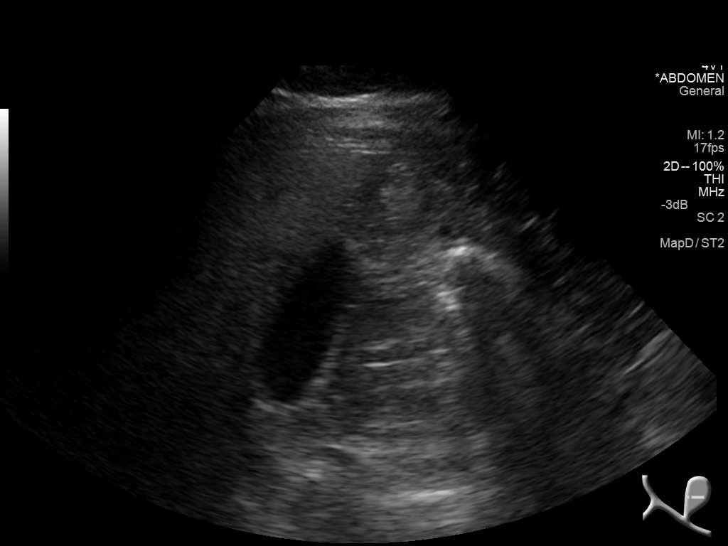
[im 14/38]
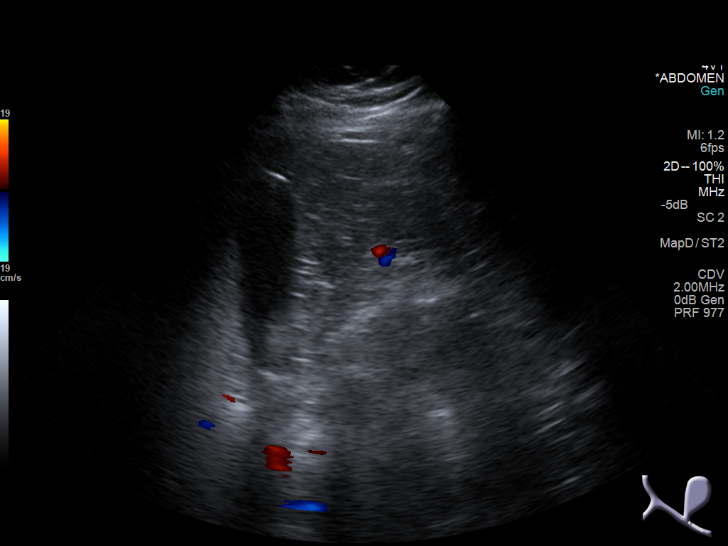
[im 17/38]
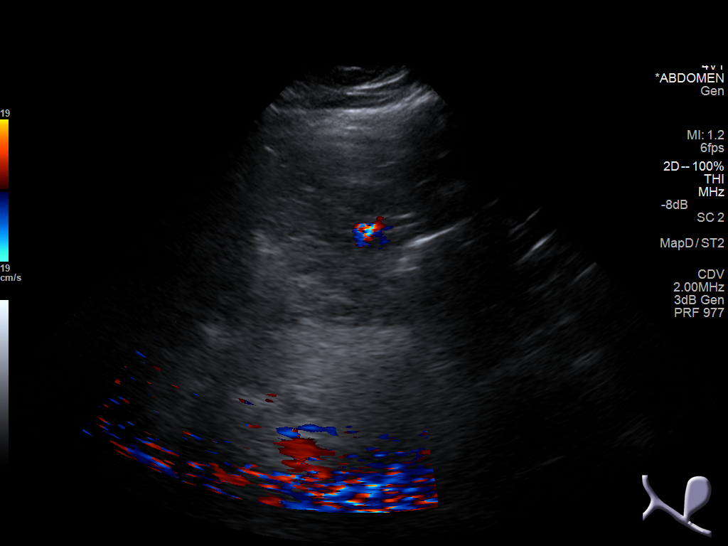
[im 21/38]
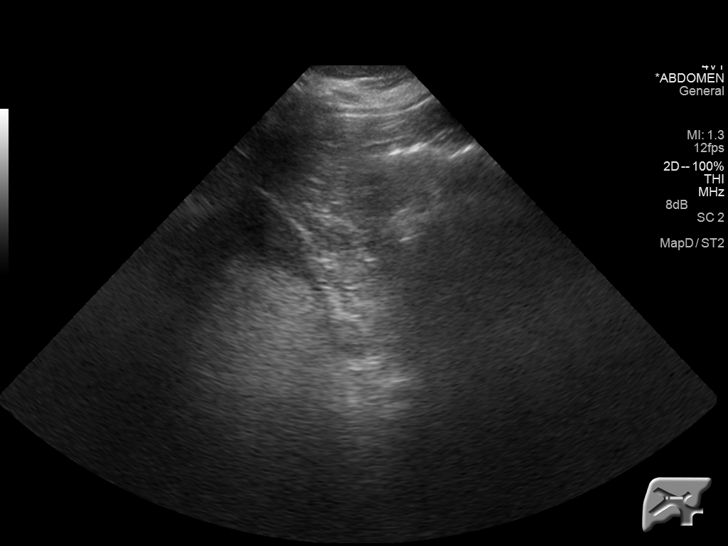
[im 24/38]
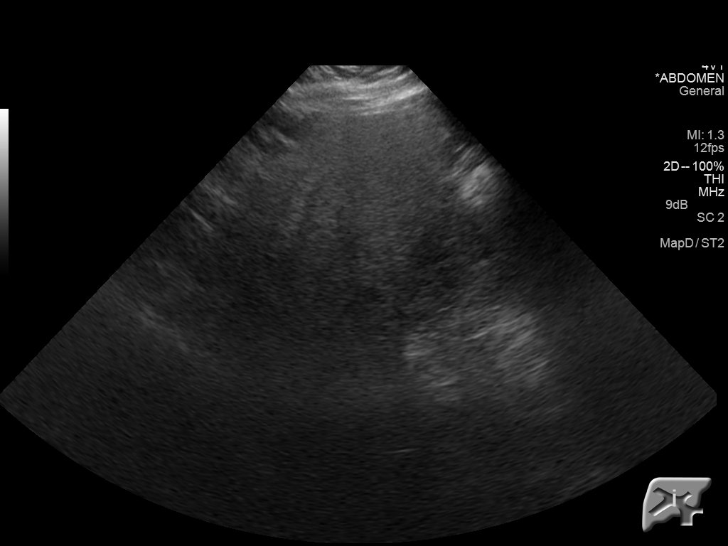
[im 25/38]
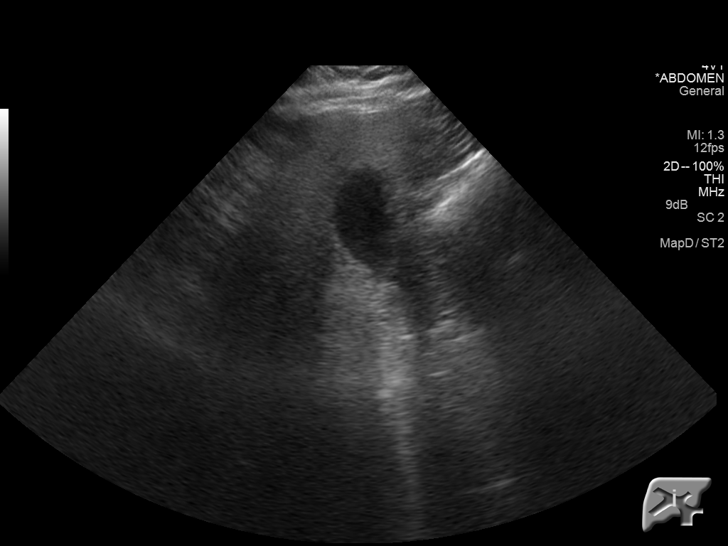
[im 28/38]
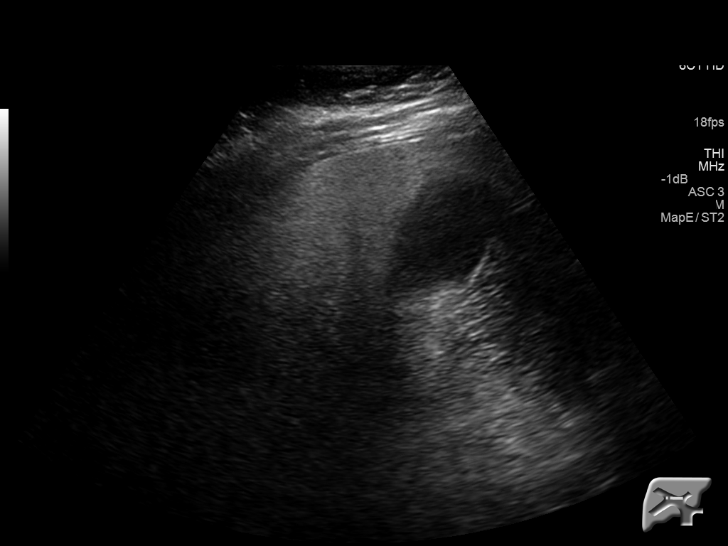
[im 31/38]
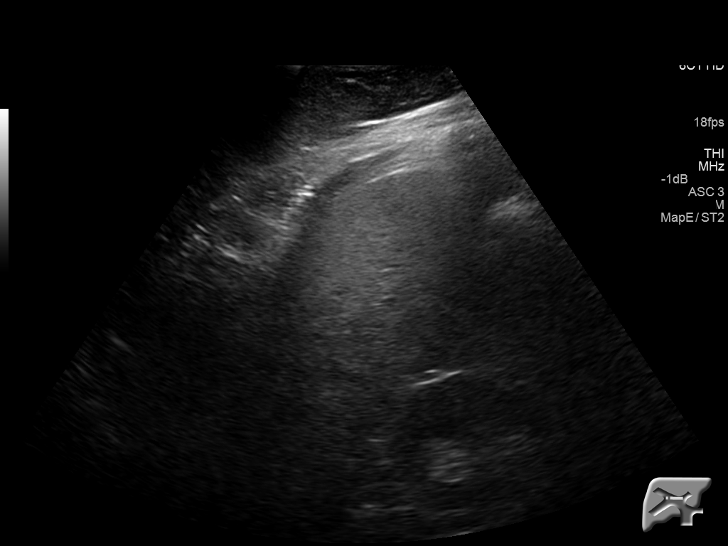
[im 34/38]
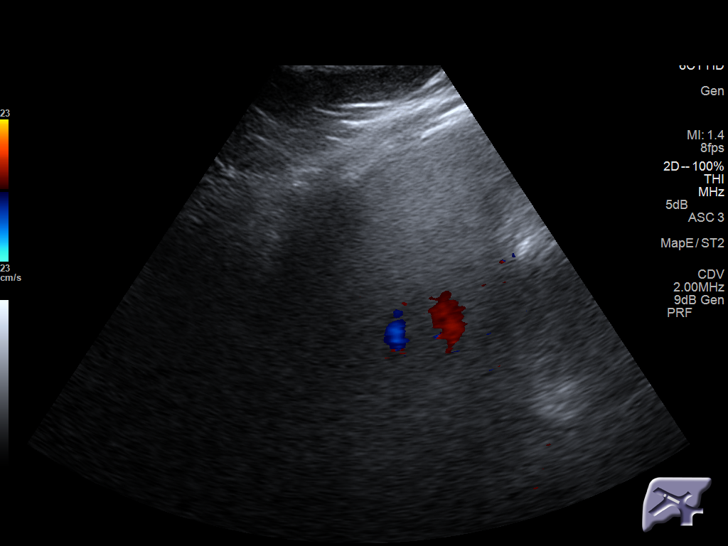
[im 38/38]
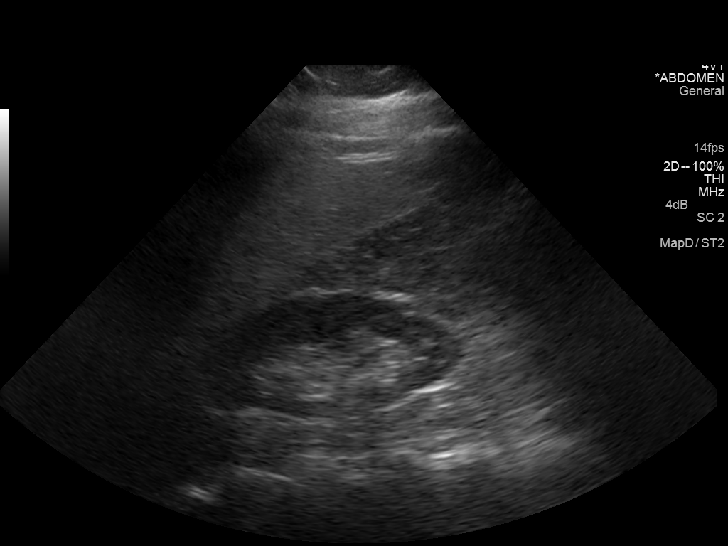

[14 of 25 positions shown; findings below may reference images not displayed]

FINDINGS: Gallbladder:

No gallstones or wall thickening visualized. No sonographic Murphy
sign noted.

Common bile duct:

Diameter: 0.7 cm, slightly prominent for the patient's age.

Liver:

No focal lesion identified. Diffusely increased parenchymal
echogenicity and coarsened echotexture, compatible with fatty
infiltration. The left hepatic lobe is not well characterized due to
limitations in patient positioning.
IMPRESSION: 1. No definite acute abnormality seen within the right upper
quadrant.
2. Slight prominence of the common bile duct may remain within
normal limits, measuring 0.7 cm.
3. Diffuse fatty infiltration within the liver.

## 2016-10-18 DIAGNOSIS — L509 Urticaria, unspecified: Secondary | ICD-10-CM | POA: Insufficient documentation

## 2016-10-19 ENCOUNTER — Emergency Department
Admission: EM | Admit: 2016-10-19 | Discharge: 2016-10-19 | Disposition: A | Discharge status: Home / Self Care | Payer: Self-pay | Attending: Emergency Medicine | Admitting: Emergency Medicine

## 2016-10-19 ENCOUNTER — Encounter: Payer: Self-pay | Admitting: *Deleted

## 2016-10-19 DIAGNOSIS — L509 Urticaria, unspecified: Secondary | ICD-10-CM

## 2016-10-19 MED ORDER — EPINEPHRINE 0.3 MG/0.3ML IJ SOAJ: 0.3000 mg | Freq: Once | INTRAMUSCULAR | Status: DC

## 2016-10-19 MED ORDER — PREDNISONE 20 MG PO TABS
60.0000 mg | ORAL_TABLET | Freq: Once | ORAL | Status: AC
  Administered 2016-10-19: 60 mg via ORAL
  Filled 2016-10-19: qty 3

## 2016-10-19 MED ORDER — PREDNISONE 10 MG PO TABS: 50.0000 mg | ORAL_TABLET | Freq: Every day | ORAL | 0 refills | Status: AC

## 2016-10-19 MED ORDER — FAMOTIDINE 20 MG PO TABS
40.0000 mg | ORAL_TABLET | Freq: Once | ORAL | Status: AC
  Administered 2016-10-19: 40 mg via ORAL
  Filled 2016-10-19: qty 2

## 2016-10-19 MED ORDER — DIPHENHYDRAMINE HCL 25 MG PO CAPS
50.0000 mg | ORAL_CAPSULE | Freq: Once | ORAL | Status: AC
  Administered 2016-10-19: 50 mg via ORAL
  Filled 2016-10-19: qty 2

## 2016-10-19 NOTE — Discharge Instructions (Signed)
In addition to the prednisone, please take 25 mg of over-the-counter Benadryl 4 times a day and 40 mg of over-the-counter Pepcid twice a day for the next week to help with your symptoms.  Return to the emergency department for any new or worsening symptoms such as shortness of breath.  It was a pleasure to take care of you today, and thank you for coming to our emergency department.  If you have any questions or concerns before leaving please ask the nurse to grab me and I'm more than happy to go through your aftercare instructions again.  If you were prescribed any opioid pain medication today such as Norco, Vicodin, Percocet, morphine, hydrocodone, or oxycodone please make sure you do not drive when you are taking this medication as it can alter your ability to drive safely.  If you have any concerns once you are home that you are not improving or are in fact getting worse before you can make it to your follow-up appointment, please do not hesitate to call [phone] and come back for further evaluation.  Merrily Brittle MD  Results for orders placed or performed during the hospital encounter of 06/15/15  CBC with Differential  Result Value Ref Range   WBC 9.8 3.8 - 10.6 K/uL   RBC 5.43 4.40 - 5.90 MIL/uL   Hemoglobin 16.1 13.0 - 18.0 g/dL   HCT 16.1 09.6 - 04.5 %   MCV 88.4 80.0 - 100.0 fL   MCH 29.7 26.0 - 34.0 pg   MCHC 33.7 32.0 - 36.0 g/dL   RDW 40.9 81.1 - 91.4 %   Platelets 342 150 - 440 K/uL   Neutrophils Relative % 36 %   Neutro Abs 3.6 1.4 - 6.5 K/uL   Lymphocytes Relative 48 %   Lymphs Abs 4.7 (H) 1.0 - 3.6 K/uL   Monocytes Relative 11 %   Monocytes Absolute 1.1 (H) 0.2 - 1.0 K/uL   Eosinophils Relative 4 %   Eosinophils Absolute 0.4 0 - 0.7 K/uL   Basophils Relative 1 %   Basophils Absolute 0.1 0 - 0.1 K/uL  Comprehensive metabolic panel  Result Value Ref Range   Sodium 139 135 - 145 mmol/L   Potassium 3.9 3.5 - 5.1 mmol/L   Chloride 107 101 - 111 mmol/L   CO2 23 22 - 32  mmol/L   Glucose, Bld 118 (H) 65 - 99 mg/dL   BUN 13 6 - 20 mg/dL   Creatinine, Ser 7.82 0.61 - 1.24 mg/dL   Calcium 9.7 8.9 - 95.6 mg/dL   Total Protein 7.7 6.5 - 8.1 g/dL   Albumin 4.4 3.5 - 5.0 g/dL   AST 44 (H) 15 - 41 U/L   ALT 62 17 - 63 U/L   Alkaline Phosphatase 70 38 - 126 U/L   Total Bilirubin 0.8 0.3 - 1.2 mg/dL   GFR calc non Af Amer >60 >60 mL/min   GFR calc Af Amer >60 >60 mL/min   Anion gap 9 5 - 15  Ethanol  Result Value Ref Range   Alcohol, Ethyl (B) <5 <5 mg/dL  Acetaminophen level  Result Value Ref Range   Acetaminophen (Tylenol), Serum <10 (L) 10 - 30 ug/mL  Salicylate level  Result Value Ref Range   Salicylate Lvl <4.0 2.8 - 30.0 mg/dL  Lipase, blood  Result Value Ref Range   Lipase 31 11 - 51 U/L

## 2016-10-19 NOTE — ED Notes (Signed)
Patient observed in the lobby in no acute distress. Awaiting room for MD eval.

## 2016-10-19 NOTE — ED Provider Notes (Signed)
[facility] Emergency Department Provider Note  ____________________________________________   First MD Initiated Contact with Patient 10/19/16 0304     (approximate)  I have reviewed the triage vital signs and the nursing notes.   HISTORY  Chief Complaint Urticaria    HPI Reginald Randall is a 41 y.o. male comes to the emergency department with 2 days of itching rash across his chest arms and legs. The day before the rash started he took edema of the first time in his life secondary to what he believes was a kidney stone. He's had no shortness of breath. No abdominal pain nausea or vomiting. He has no known allergies.   No past medical history on file.  There are no active problems to display for this patient.   No past surgical history on file.  Prior to Admission medications   Medication Sig Start Date End Date Taking? Authorizing Provider  pantoprazole (PROTONIX) 40 MG tablet Take 1 tablet (40 mg total) by mouth daily. 06/15/15   Irean Hong, MD  predniSONE (DELTASONE) 10 MG tablet Take 5 tablets (50 mg total) by mouth daily. 10/19/16 10/24/16  Merrily Brittle, MD  sucralfate (CARAFATE) 1 G tablet Take 1 tablet (1 g total) by mouth 4 (four) times daily. 06/10/15 06/09/16  Jene Every, MD    Allergies Patient has no known allergies.  No family history on file.  Social History Social History  Substance Use Topics  . Smoking status: Never Smoker  . Smokeless tobacco: Never Used  . Alcohol use No    Review of Systems Constitutional: No fever/chills Eyes: No visual changes. ENT: No sore throat. Cardiovascular: Denies chest pain. Respiratory: Denies shortness of breath. Gastrointestinal: No abdominal pain.  No nausea, no vomiting.  No diarrhea.  No constipation. Genitourinary: Negative for dysuria. Musculoskeletal: Negative for back pain. Skin: Positive for rash. Neurological: Negative for headaches, focal weakness or  numbness.  10-point ROS otherwise negative.  ____________________________________________   PHYSICAL EXAM:  VITAL SIGNS: ED Triage Vitals  Enc Vitals Group     BP 10/19/16 0017 134/79     Pulse Rate 10/19/16 0017 (!) 120     Resp 10/19/16 0017 20     Temp 10/19/16 0017 98.4 F (36.9 C)     Temp Source 10/19/16 0017 Oral     SpO2 10/19/16 0017 96 %     Weight 10/19/16 0018 264 lb (119.7 kg)     Height 10/19/16 0018  (1.778 m)     Head Circumference --      Peak Flow --      Pain Score --      Pain Loc --      Pain Edu? --      Excl. in GC? --     Constitutional: Alert and oriented x 4 well appearing nontoxic no diaphoresis speaks in full, clear sentences Eyes: PERRL EOMI. Head: Atraumatic. Nose: No congestion/rhinnorhea. Mouth/Throat: No trismus Neck: No stridor.   Cardiovascular: Normal rate, regular rhythm. Grossly normal heart sounds.  Good peripheral circulation. Respiratory: Normal respiratory effort.  No retractions. Lungs CTAB and moving good air Gastrointestinal: Soft nondistended nontender  Musculoskeletal: No lower extremity edema   Neurologic:  Normal speech and language. No gross focal neurologic deficits are appreciated. Skin:  Diffuse blanching urticaria across chest back or arms and legs Psychiatric: Mood and affect are normal. Speech and behavior are normal.    ____________________________________________    ____________________________________________   LABS (all labs ordered  are listed, but only abnormal results are displayed)  Labs Reviewed - No data to display  __________________________________________  EKG   ____________________________________________  RADIOLOGY   ____________________________________________   PROCEDURES  Procedure(s) performed: no  Procedures  Critical Care performed: no  ____________________________________________   INITIAL IMPRESSION / ASSESSMENT AND PLAN / ED COURSE  Pertinent labs &  imaging results that were available during my care of the patient were reviewed by me and considered in my medical decision making (see chart for details).  Patient is very well-appearing and hemodynamically stable with a clear urticarial rash. His only new exposure is. In and is likely the etiology. The patient clearly does not have anaphylaxis, however given the duration of the symptoms and how the urticaria has spread across most of his body I offered him intramuscular epinephrine, however he declined. We'll treat him symptomatically with several days of steroids and H1 and H2 blockers. He is discharged home in improved and good condition.      ____________________________________________   FINAL CLINICAL IMPRESSION(S) / ED DIAGNOSES  Final diagnoses:  Urticaria      NEW MEDICATIONS STARTED DURING THIS VISIT:  Discharge Medication List as of 10/19/2016  3:54 AM    START taking these medications   Details  predniSONE (DELTASONE) 10 MG tablet Take 5 tablets (50 mg total) by mouth daily., Starting Tue 10/19/2016, Until Sun 10/24/2016, Print         Note:  This document was prepared using Dragon voice recognition software and may include unintentional dictation errors.     Merrily Brittle, MD 10/19/16 [phone]

## 2016-10-19 NOTE — ED Triage Notes (Signed)
Pt red itching rash all over body since yesterday.  Pt took benadryl at 1830 today without relief.  No resp distress.  Pt alert.

## 2023-03-16 ENCOUNTER — Encounter: Payer: Self-pay | Admitting: Emergency Medicine

## 2023-03-16 ENCOUNTER — Emergency Department
Admission: EM | Admit: 2023-03-16 | Discharge: 2023-03-16 | Disposition: A | Discharge status: Home / Self Care | Payer: Medicaid Other | Attending: Emergency Medicine | Admitting: Emergency Medicine

## 2023-03-16 ENCOUNTER — Other Ambulatory Visit: Payer: Self-pay

## 2023-03-16 DIAGNOSIS — R1013 Epigastric pain: Secondary | ICD-10-CM | POA: Diagnosis present

## 2023-03-16 DIAGNOSIS — R1012 Left upper quadrant pain: Secondary | ICD-10-CM | POA: Diagnosis not present

## 2023-03-16 LAB — CBC
HCT: 45.7 % (ref 39.0–52.0)
Hemoglobin: 16.4 g/dL (ref 13.0–17.0)
MCH: 31.3 pg (ref 26.0–34.0)
MCHC: 35.9 g/dL (ref 30.0–36.0)
MCV: 87.2 fL (ref 80.0–100.0)
Platelets: 348 10*3/uL (ref 150–400)
RBC: 5.24 MIL/uL (ref 4.22–5.81)
RDW: 12.9 % (ref 11.5–15.5)
WBC: 13.5 10*3/uL — ABNORMAL HIGH (ref 4.0–10.5)
nRBC: 0 % (ref 0.0–0.2)

## 2023-03-16 LAB — COMPREHENSIVE METABOLIC PANEL
ALT: 88 U/L — ABNORMAL HIGH (ref 0–44)
AST: 65 U/L — ABNORMAL HIGH (ref 15–41)
Albumin: 4.7 g/dL (ref 3.5–5.0)
Alkaline Phosphatase: 74 U/L (ref 38–126)
Anion gap: 8 (ref 5–15)
BUN: 12 mg/dL (ref 6–20)
CO2: 21 mmol/L — ABNORMAL LOW (ref 22–32)
Calcium: 9.4 mg/dL (ref 8.9–10.3)
Chloride: 106 mmol/L (ref 98–111)
Creatinine, Ser: 0.96 mg/dL (ref 0.61–1.24)
GFR, Estimated: >60 mL/min (ref >60)
Glucose, Bld: 127 mg/dL — ABNORMAL HIGH (ref 70–99)
Potassium: 4 mmol/L (ref 3.5–5.1)
Sodium: 135 mmol/L (ref 135–145)
Total Bilirubin: 0.9 mg/dL (ref 0.3–1.2)
Total Protein: 7.6 g/dL (ref 6.5–8.1)

## 2023-03-16 LAB — LIPASE, BLOOD: Lipase: 31 U/L (ref 11–51)

## 2023-03-16 MED ORDER — OMEPRAZOLE MAGNESIUM 20 MG PO TBEC: 40.0000 mg | DELAYED_RELEASE_TABLET | Freq: Every day | ORAL | 0 refills | Status: AC

## 2023-03-16 MED ORDER — ALUM & MAG HYDROXIDE-SIMETH 200-200-20 MG/5ML PO SUSP
30.0000 mL | Freq: Once | ORAL | Status: AC
  Administered 2023-03-16: 30 mL via ORAL
  Filled 2023-03-16: qty 30

## 2023-03-16 MED ORDER — FAMOTIDINE 20 MG PO TABS: 20.0000 mg | ORAL_TABLET | Freq: Two times a day (BID) | ORAL | 0 refills | Status: AC

## 2023-03-16 NOTE — ED Triage Notes (Signed)
Pt to ED via POV for abdominal pain for the past 3 weeks. Pt states that he has some nausea as well. Pt has not vomited. Pt concerned he may have an ulcer. Pt is currently in NAD.

## 2023-03-16 NOTE — Discharge Instructions (Signed)
Please go to the following website to schedule new (and existing) patient appointments:   [email or web address]   The following is a list of primary care offices in the area who are accepting new patients at this time.  Please reach out to one of them directly and let them know you would like to schedule an appointment to follow up on an Emergency Department visit, and/or to establish a new primary care provider (PCP).  There are likely other primary care clinics in the are who are accepting new patients, but this is an excellent place to start:  [facility] Lead physician: Dr Shirlee Latch [address] [place], [place] [address] [phone]  [facility] Lead Physician: Dr Alba Cory [address], [place], [place] [address] [phone]  [facility]  Lead Physician: Dr Olevia Perches [address] [place], [place] [address] [phone]  [facility] Lead Physician: Dr Sofie Hartigan [address], [place], [place] [address] [phone]  [facility] Lead Physician: Dr Bari Edward [address] [place], [place], [place] [address] [phone]   Please establish care with 1 of these providers in order to arrange H. pylori testing and possibly an EGD.  Please take the medications as prescribed.  Please return for any new, worsening, or change in symptoms or other concerns.  It was a pleasure caring for you today.

## 2023-03-16 NOTE — ED Provider Notes (Signed)
[facility] Provider Note    Event Date/Time   First MD Initiated Contact with Patient 03/16/23 1107     (approximate)   History   Abdominal Pain   HPI  Reginald Randall is a 47 y.o. male with a past medical history of H. pylori who presents today for evaluation of left upper quadrant pain.  Patient reports that he had H. pylori approximately 15 years ago and his symptoms today feel the same.  He reports that for the past 3 weeks he has had a burning and gnawing sensation in his left upper quadrant.  He has been eating primarily yogurt and bananas as any spicy or acidic food worsens of symptoms.  He feels nauseated but has not had any vomiting.  No fevers or chills.  No black or bloody stool.  There are no problems to display for this patient.         Physical Exam   Triage Vital Signs: ED Triage Vitals  Encounter Vitals Group     BP 03/16/23 1004 119/85     Systolic BP Percentile --      Diastolic BP Percentile --      Pulse Rate 03/16/23 1003 97     Resp 03/16/23 1003 16     Temp 03/16/23 1003 98.9 F (37.2 C)     Temp src --      SpO2 03/16/23 1003 96 %     Weight 03/16/23 1003 242 lb (109.8 kg)     Height 03/16/23 1003 5\' 10"  (1.778 m)     Head Circumference --      Peak Flow --      Pain Score 03/16/23 1003 6     Pain Loc --      Pain Education --      Exclude from Growth Chart --     Most recent vital signs: Vitals:   03/16/23 1004 03/16/23 1100  BP: 119/85 123/75  Pulse:    Resp:  15  Temp:  97.8 F (36.6 C)  SpO2:  93%    Physical Exam Vitals and nursing note reviewed.  Constitutional:      General: Awake and alert. No acute distress.    Appearance: Normal appearance. The patient is obese.  HENT:     Head: Normocephalic and atraumatic.     Mouth: Mucous membranes are moist.  Eyes:     General: PERRL. Normal EOMs        Right eye: No discharge.        Left eye: No discharge.     Conjunctiva/sclera: Conjunctivae  normal.  Cardiovascular:     Rate and Rhythm: Normal rate and regular rhythm.     Pulses: Normal pulses.  Pulmonary:     Effort: Pulmonary effort is normal. No respiratory distress.     Breath sounds: Normal breath sounds.  Abdominal:     Abdomen is soft. There is mild discomfort to the left upper quadrant. No rebound or guarding. No distention.  No right upper quadrant or epigastric tenderness.  No right lower or left lower quadrant abdominal tenderness.  Negative Murphy sign.  No CVA tenderness. Musculoskeletal:        General: No swelling. Normal range of motion.     Cervical back: Normal range of motion and neck supple.  Skin:    General: Skin is warm and dry.     Capillary Refill: Capillary refill takes less than 2 seconds.  Findings: No rash.  Neurological:     Mental Status: The patient is awake and alert.      ED Results / Procedures / Treatments   Labs (all labs ordered are listed, but only abnormal results are displayed) Labs Reviewed  COMPREHENSIVE METABOLIC PANEL - Abnormal; Notable for the following components:      Result Value   CO2 21 (*)    Glucose, Bld 127 (*)    AST 65 (*)    ALT 88 (*)    All other components within normal limits  CBC - Abnormal; Notable for the following components:   WBC 13.5 (*)    All other components within normal limits  LIPASE, BLOOD     EKG     RADIOLOGY     PROCEDURES:  Critical Care performed:   Procedures   MEDICATIONS ORDERED IN ED: Medications  alum & mag hydroxide-simeth (MAALOX/MYLANTA) 200-200-20 MG/5ML suspension 30 mL (30 mLs Oral Given 03/16/23 1140)     IMPRESSION / MDM / ASSESSMENT AND PLAN / ED COURSE  I reviewed the triage vital signs and the nursing notes.   Differential diagnosis includes, but is not limited to, gastritis, peptic ulcer disease, pancreatitis, H. pylori.  Patient is awake and alert, hemodynamically stable and afebrile.  He is nontoxic in appearance.  He has no  reproducible tenderness to his abdominal area.  He has negative Murphy sign, no tenderness in his right upper quadrant, do not suspect cholecystitis.  He has mild bump in his LFTs consistent with fatty liver disease and similar to previous.  Normal lipase.  Normal bilirubin, non-obstructive pattern to LFTs.  No lower abdominal tenderness to suggest appendicitis, diverticulitis, or other abdominal pathology.  No pain out of proportion or abdominal tenderness to suggest perforated ulcer.  Patient was treated symptomatically with GI cocktail with significant improvement of her symptoms.  I suspect gastritis versus peptic ulcer disease versus H. pylori is most likely etiology.  Normal H&H, do not suspect bleeding ulcer, no black or bloody stool.  He was started on proton pump inhibitor and H2 blocker, which she had been on in the past, but is not currently using.  Also discussed foods that are gastric irritants.  He was instructed to follow-up with PCP versus gastroenterology for H. pylori testing and EGD.  We discussed return precautions importance of close outpatient follow-up.  Patient understands and agrees with plan.  He was discharged in stable condition.  Patient's presentation is most consistent with acute complicated illness / injury requiring diagnostic workup.      FINAL CLINICAL IMPRESSION(S) / ED DIAGNOSES   Final diagnoses:  Epigastric pain     Rx / DC Orders   ED Discharge Orders          Ordered    omeprazole (PRILOSEC OTC) 20 MG tablet  Daily        03/16/23 1129    famotidine (PEPCID) 20 MG tablet  2 times daily        03/16/23 1129             Note:  This document was prepared using Dragon voice recognition software and may include unintentional dictation errors.   Reginald Randall 03/16/23 1717    Jene Every, MD 03/21/23 1048
